# Patient Record
Sex: Female | Born: 1970 | Race: Black or African American | Hispanic: No | Marital: Single | Smoking: Former smoker
Health system: Southern US, Community
[De-identification: ages and names within clinical notes are randomized; demographics above are authoritative.]

## PROBLEM LIST (undated history)

## (undated) DIAGNOSIS — Z8041 Family history of malignant neoplasm of ovary: Secondary | ICD-10-CM

## (undated) DIAGNOSIS — C50919 Malignant neoplasm of unspecified site of unspecified female breast: Secondary | ICD-10-CM

## (undated) DIAGNOSIS — Z801 Family history of malignant neoplasm of trachea, bronchus and lung: Secondary | ICD-10-CM

## (undated) DIAGNOSIS — Z800 Family history of malignant neoplasm of digestive organs: Secondary | ICD-10-CM

## (undated) DIAGNOSIS — D649 Anemia, unspecified: Secondary | ICD-10-CM

## (undated) HISTORY — DX: Family history of malignant neoplasm of digestive organs: Z80.0

## (undated) HISTORY — DX: Family history of malignant neoplasm of ovary: Z80.41

## (undated) HISTORY — DX: Anemia, unspecified: D64.9

## (undated) HISTORY — DX: Family history of malignant neoplasm of trachea, bronchus and lung: Z80.1

---
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
## 2017-08-28 HISTORY — PX: OTHER SURGICAL HISTORY: SHX169

## 2017-11-26 HISTORY — PX: BREAST BIOPSY: SHX20

## 2017-12-26 DIAGNOSIS — C50919 Malignant neoplasm of unspecified site of unspecified female breast: Secondary | ICD-10-CM

## 2017-12-26 HISTORY — DX: Malignant neoplasm of unspecified site of unspecified female breast: C50.919

## 2018-02-25 HISTORY — PX: AUGMENTATION MAMMAPLASTY: SUR837

## 2018-02-25 HISTORY — PX: MASTECTOMY: SHX3

## 2019-04-01 ENCOUNTER — Other Ambulatory Visit: Payer: Self-pay

## 2019-04-02 ENCOUNTER — Other Ambulatory Visit: Payer: Self-pay

## 2019-04-02 ENCOUNTER — Encounter: Payer: Self-pay | Admitting: Oncology

## 2019-04-02 ENCOUNTER — Encounter (INDEPENDENT_AMBULATORY_CARE_PROVIDER_SITE_OTHER): Payer: Self-pay

## 2019-04-02 ENCOUNTER — Inpatient Hospital Stay: Payer: BC Managed Care – PPO | Attending: Oncology | Admitting: Oncology

## 2019-04-02 VITALS — BP 109/72 | HR 60 | Temp 98.3°F | Resp 16 | Ht 64.0 in | Wt 130.1 lb

## 2019-04-02 DIAGNOSIS — Z853 Personal history of malignant neoplasm of breast: Secondary | ICD-10-CM

## 2019-04-02 DIAGNOSIS — Z87891 Personal history of nicotine dependence: Secondary | ICD-10-CM | POA: Insufficient documentation

## 2019-04-02 DIAGNOSIS — Z9889 Other specified postprocedural states: Secondary | ICD-10-CM

## 2019-04-02 DIAGNOSIS — Z8041 Family history of malignant neoplasm of ovary: Secondary | ICD-10-CM | POA: Diagnosis not present

## 2019-04-02 DIAGNOSIS — Z791 Long term (current) use of non-steroidal anti-inflammatories (NSAID): Secondary | ICD-10-CM | POA: Diagnosis not present

## 2019-04-02 DIAGNOSIS — Z9882 Breast implant status: Secondary | ICD-10-CM | POA: Insufficient documentation

## 2019-04-02 NOTE — Progress Notes (Signed)
Pt moved here from [facility] and was dx in 2019 with breast cancer and ER, PR positive and she was told that she should take tamoxifen but she did not want to start them because she is trying to get pregnant.

## 2019-04-03 ENCOUNTER — Encounter: Payer: Self-pay | Admitting: Oncology

## 2019-04-03 DIAGNOSIS — Z853 Personal history of malignant neoplasm of breast: Secondary | ICD-10-CM | POA: Insufficient documentation

## 2019-04-03 DIAGNOSIS — Z8041 Family history of malignant neoplasm of ovary: Secondary | ICD-10-CM | POA: Insufficient documentation

## 2019-04-03 DIAGNOSIS — Z9889 Other specified postprocedural states: Secondary | ICD-10-CM | POA: Insufficient documentation

## 2019-04-03 NOTE — Progress Notes (Signed)
Hematology/Oncology Consult note [facility] Telephone:([phone] Fax:([phone]   Patient Care Team: Donnamarie Rossetti, PA-C as PCP - General (Family Medicine)  REFERRING PROVIDER: Grayland Ormond Hestle,*  CHIEF COMPLAINTS/REASON FOR VISIT:  Evaluation of breast cancer  HISTORY OF PRESENTING ILLNESS:   Judith Watkins is a  48 y.o.  female with PMH listed below was seen in consultation at the request of  Donnamarie Rossetti,*  for evaluation of breast cancer. Patient moved from [place] to [place] earlier this year. She has establish care with primary care provider and also wants to establish care with oncology for history of breast cancer. Detailed oncology history was not available to me at this point. I have some of the records. 12/20/2017 bilateral mammogram showed grouped amorphus calcification in a branching distribution are noted in the slightly inferior right breast.  Recommend biopsy. Multiple additional groups of calcification within the right breast.  Expectant management of this calcification is recommended.   Vascular ill-defined hypoechoic mass within the right breast at 9:00 axis, 6 cm from the nipple.  Biopsy recommended. Complex solid and cystic mass in the left breast at the 12:00 axis periareolar.  Recommend biopsy . #01/02/2018, ultrasound biopsy breast  I have patient's reports from 01/17/2018 MRI breast with and without contrast bilateral which showed extreme febrile glandular tissue, market nodular background parenchymal enhancement.  Innumerable bilateral scattered T2 hyperintense masses consistent with fibrocystic changes.  Biopsy clip is noted in the right lower outer breast at the site of stereotactic biopsy demonstrating DCIS. Biopsy clip is noted in the right slightly upper outer breast corresponding to the site of ultrasound-guided biopsy demonstrating DCIS.  1.1 cm x 1.2 cm of masslike enhancement surrounding this  biopsy site. Linear non-mass enhancement spanning approximately 5 cm x 3 cm is noted within the right outer breast.  A biopsy clip is noted in the left retroareolar breast at the site of benign biopsy for which pathology demonstrated benign breast tissue. No evidence of axillary lymphadenopathy.  I do not have final biopsy pathology and mastectomy pathology.  Per patient, she underwent left mastectomy with reconstruction.  She was told her cancer was estrogen receptor positive and was suggested to take tamoxifen.  Patient opted not to take tamoxifen due to desire of conceiving. Today she has no new complaints of her breast.  Occasionally she has soreness of right breast. Denies any fever, chills, chest pain, abdominal pain, back pain.  Review of Systems  Constitutional: Negative for appetite change, chills, fatigue and fever.  HENT:   Negative for hearing loss and voice change.   Eyes: Negative for eye problems.  Respiratory: Negative for chest tightness and cough.   Cardiovascular: Negative for chest pain.  Gastrointestinal: Negative for abdominal distention, abdominal pain and blood in stool.  Endocrine: Negative for hot flashes.  Genitourinary: Negative for difficulty urinating and frequency.   Musculoskeletal: Negative for arthralgias.  Skin: Negative for itching and rash.  Neurological: Negative for extremity weakness.  Hematological: Negative for adenopathy.  Psychiatric/Behavioral: Negative for confusion.    MEDICAL HISTORY:  Past Medical History:  Diagnosis Date  . Anemia   . Breast cancer ([place]) 12/2017   right    SURGICAL HISTORY: Past Surgical History:  Procedure Laterality Date  . right mastectomy Right 2019    SOCIAL HISTORY: Social History   Socioeconomic History  . Marital status: Single    Spouse name: Not on file  . Number of children: Not on file  . Years  of education: Not on file  . Highest education level: Not on file  Occupational History  . Not  on file  Social Needs  . Financial resource strain: Not on file  . Food insecurity    Worry: Not on file    Inability: Not on file  . Transportation needs    Medical: Not on file    Non-medical: Not on file  Tobacco Use  . Smoking status: Former Smoker    Packs/day: 0.25    Types: Cigarettes    Quit date: 2011    Years since quitting: 9.6  . Smokeless tobacco: Never Used  Substance and Sexual Activity  . Alcohol use: Yes    Alcohol/week: 2.0 standard drinks    Types: 2 Cans of beer per week  . Drug use: Never  . Sexual activity: Yes  Lifestyle  . Physical activity    Days per week: Not on file    Minutes per session: Not on file  . Stress: Not on file  Relationships  . Social Herbalist on phone: Not on file    Gets together: Not on file    Attends religious service: Not on file    Active member of club or organization: Not on file    Attends meetings of clubs or organizations: Not on file    Relationship status: Not on file  . Intimate partner violence    Fear of current or ex partner: Not on file    Emotionally abused: Not on file    Physically abused: Not on file    Forced sexual activity: Not on file  Other Topics Concern  . Not on file  Social History Narrative  . Not on file    FAMILY HISTORY: Family History  Problem Relation Age of Onset  . Ovarian cancer Mother   . Lung cancer Father     ALLERGIES:  has No Known Allergies.  MEDICATIONS:  Current Outpatient Medications  Medication Sig Dispense Refill  . ferrous sulfate 325 (65 FE) MG tablet Take 325 mg by mouth 2 (two) times daily.    Marland Kitchen ibuprofen (ADVIL) 200 MG tablet Take 600 mg by mouth every 6 (six) hours as needed.    . Prenatal 28-0.8 MG TABS Take 1 tablet by mouth daily.     No current facility-administered medications for this visit.      PHYSICAL EXAMINATION: ECOG PERFORMANCE STATUS: 0 - Asymptomatic Vitals:   04/02/19 1105  BP: 109/72  Pulse: 60  Resp: 16  Temp: 98.3  F (36.8 C)   Filed Weights   04/02/19 1105  Weight: 130 lb 1.6 oz (59 kg)    Physical Exam Constitutional:      General: She is not in acute distress. HENT:     Head: Normocephalic and atraumatic.  Eyes:     General: No scleral icterus.    Pupils: Pupils are equal, round, and reactive to light.  Neck:     Musculoskeletal: Normal range of motion and neck supple.  Cardiovascular:     Rate and Rhythm: Normal rate and regular rhythm.     Heart sounds: Normal heart sounds.  Pulmonary:     Effort: Pulmonary effort is normal. No respiratory distress.     Breath sounds: No wheezing.  Abdominal:     General: Bowel sounds are normal. There is no distension.     Palpations: Abdomen is soft. There is no mass.     Tenderness: There is no abdominal  tenderness.  Musculoskeletal: Normal range of motion.        General: No deformity.  Skin:    General: Skin is warm and dry.     Findings: No erythema or rash.  Neurological:     Mental Status: She is alert and oriented to person, place, and time.     Cranial Nerves: No cranial nerve deficit.     Coordination: Coordination normal.  Psychiatric:        Behavior: Behavior normal.        Thought Content: Thought content normal.   Breast exam is performed in seated and lying down position. Patient is status post right mastectomy with reconstruction. The implant edges are intact and there is no evidence of any chest wall recurrence.  No palpable axillary lymphadenopathy. Lumpy left breast, no discrete mass.    LABORATORY DATA:  I have reviewed the data as listed No results found for: WBC, HGB, HCT, MCV, PLT No results for input(s): NA, K, CL, CO2, GLUCOSE, BUN, CREATININE, CALCIUM, GFRNONAA, GFRAA, PROT, ALBUMIN, AST, ALT, ALKPHOS, BILITOT, BILIDIR, IBILI in the last 8760 hours. Iron/TIBC/Ferritin/ %Sat No results found for: IRON, TIBC, FERRITIN, IRONPCTSAT   Reviewed patient's blood work at [facility]. 03/21/2019 WBC  3.3, hemoglobin 12.6, MCV 84.5, platelet 1 76,000. Chemistry sodium 140, potassium 4.4, chloride 107, creatinine 0.7, AST 17, ALT 13, alkaline phosphatase 39 bilirubin 0.3.  RADIOGRAPHIC STUDIES: I have personally reviewed the radiological images as listed and agreed with the findings in the report. No results found.    ASSESSMENT & PLAN:  1. History of breast cancer in female   2. Family history of ovarian cancer   3. H/O breast reconstruction    #Reviewed limited oncology records. Will need to obtain biopsy and mastectomy pathology and her oncology notes. Patient was recommended to start on tamoxifen by previous oncologist.  She declined due to desire of trying to conceive.  # Left breast is due for screening mammogram, will obtain.  # mild right breast soreness,  She will need to establish care with plastic surgeon  for follow-up for the history of breast reconstruction. Advise patient to take calcium and vitamin D supplementation.  Family history of ovarian cancer, recommend genetic testing.  Patient is not sure if she has been tested before. She will update me if she desires genetic testing and will refer her to genetic counselor if she is interested.   Orders Placed This Encounter  Procedures  . CBC with Differential/Platelet    Standing Status:   Future    Standing Expiration Date:   04/02/2020  . Comprehensive metabolic panel    Standing Status:   Future    Standing Expiration Date:   04/02/2020    All questions were answered. The patient knows to call the clinic with any problems questions or concerns.  cc Venetia Maxon, Rolanda Jay,*    Return of visit: 3 months, check cbc and cmp Thank you for this kind referral and the opportunity to participate in the care of this patient. A copy of today's note is routed to referring provider  Total face to face encounter time for this patient visit was 45 min. >50% of the time was  spent in counseling and coordination of care.     Earlie Server, MD, PhD Hematology Oncology [facility] at [facility] Pager- [phone] 04/03/2019

## 2019-04-25 ENCOUNTER — Other Ambulatory Visit: Payer: Self-pay | Admitting: Family Medicine

## 2019-04-25 DIAGNOSIS — Z853 Personal history of malignant neoplasm of breast: Secondary | ICD-10-CM

## 2019-04-25 DIAGNOSIS — Z9011 Acquired absence of right breast and nipple: Secondary | ICD-10-CM

## 2019-04-29 ENCOUNTER — Encounter: Payer: Self-pay | Admitting: Oncology

## 2019-04-29 DIAGNOSIS — Z7189 Other specified counseling: Secondary | ICD-10-CM | POA: Insufficient documentation

## 2019-05-09 ENCOUNTER — Other Ambulatory Visit: Payer: BC Managed Care – PPO

## 2019-05-21 ENCOUNTER — Ambulatory Visit
Admission: RE | Admit: 2019-05-21 | Discharge: 2019-05-21 | Disposition: A | Discharge status: Home / Self Care | Payer: BC Managed Care – PPO | Source: Ambulatory Visit | Attending: Family Medicine | Admitting: Family Medicine

## 2019-05-21 DIAGNOSIS — Z853 Personal history of malignant neoplasm of breast: Secondary | ICD-10-CM | POA: Diagnosis present

## 2019-05-21 DIAGNOSIS — Z9011 Acquired absence of right breast and nipple: Secondary | ICD-10-CM | POA: Diagnosis present

## 2019-06-30 ENCOUNTER — Inpatient Hospital Stay: Payer: BC Managed Care – PPO | Admitting: Oncology

## 2019-06-30 ENCOUNTER — Inpatient Hospital Stay: Payer: BC Managed Care – PPO

## 2019-07-08 ENCOUNTER — Telehealth: Payer: Self-pay

## 2019-07-08 ENCOUNTER — Other Ambulatory Visit: Payer: Self-pay

## 2019-07-08 ENCOUNTER — Encounter: Payer: Self-pay | Admitting: Oncology

## 2019-07-08 ENCOUNTER — Inpatient Hospital Stay: Payer: BC Managed Care – PPO | Attending: Oncology

## 2019-07-08 ENCOUNTER — Inpatient Hospital Stay (HOSPITAL_BASED_OUTPATIENT_CLINIC_OR_DEPARTMENT_OTHER): Payer: BC Managed Care – PPO | Admitting: Oncology

## 2019-07-08 VITALS — BP 122/79 | HR 64 | Temp 98.4°F | Resp 16 | Wt 133.3 lb

## 2019-07-08 DIAGNOSIS — Z9011 Acquired absence of right breast and nipple: Secondary | ICD-10-CM | POA: Diagnosis not present

## 2019-07-08 DIAGNOSIS — Z8041 Family history of malignant neoplasm of ovary: Secondary | ICD-10-CM | POA: Insufficient documentation

## 2019-07-08 DIAGNOSIS — Z853 Personal history of malignant neoplasm of breast: Secondary | ICD-10-CM | POA: Diagnosis not present

## 2019-07-08 DIAGNOSIS — Z791 Long term (current) use of non-steroidal anti-inflammatories (NSAID): Secondary | ICD-10-CM | POA: Diagnosis not present

## 2019-07-08 DIAGNOSIS — C50411 Malignant neoplasm of upper-outer quadrant of right female breast: Secondary | ICD-10-CM | POA: Diagnosis not present

## 2019-07-08 DIAGNOSIS — Z801 Family history of malignant neoplasm of trachea, bronchus and lung: Secondary | ICD-10-CM | POA: Diagnosis not present

## 2019-07-08 DIAGNOSIS — Z87891 Personal history of nicotine dependence: Secondary | ICD-10-CM | POA: Insufficient documentation

## 2019-07-08 DIAGNOSIS — Z79899 Other long term (current) drug therapy: Secondary | ICD-10-CM | POA: Insufficient documentation

## 2019-07-08 DIAGNOSIS — Z170 Estrogen receptor positive status [ER+]: Secondary | ICD-10-CM | POA: Insufficient documentation

## 2019-07-08 LAB — COMPREHENSIVE METABOLIC PANEL
ALT: 11 U/L (ref 0–44)
AST: 17 U/L (ref 15–41)
Albumin: 3.7 g/dL (ref 3.5–5.0)
Alkaline Phosphatase: 35 U/L — ABNORMAL LOW (ref 38–126)
Anion gap: 3 — ABNORMAL LOW (ref 5–15)
BUN: 11 mg/dL (ref 6–20)
CO2: 27 mmol/L (ref 22–32)
Calcium: 8.8 mg/dL — ABNORMAL LOW (ref 8.9–10.3)
Chloride: 107 mmol/L (ref 98–111)
Creatinine, Ser: 0.78 mg/dL (ref 0.44–1.00)
GFR calc Af Amer: >60 mL/min (ref >60)
GFR calc non Af Amer: >60 mL/min (ref >60)
Glucose, Bld: 74 mg/dL (ref 70–99)
Potassium: 3.8 mmol/L (ref 3.5–5.1)
Sodium: 137 mmol/L (ref 135–145)
Total Bilirubin: 0.5 mg/dL (ref 0.3–1.2)
Total Protein: 7.3 g/dL (ref 6.5–8.1)

## 2019-07-08 LAB — CBC WITH DIFFERENTIAL/PLATELET
Abs Immature Granulocytes: 0 10*3/uL (ref 0.00–0.07)
Basophils Absolute: 0 10*3/uL (ref 0.0–0.1)
Basophils Relative: 1 %
Eosinophils Absolute: 0.2 10*3/uL (ref 0.0–0.5)
Eosinophils Relative: 4 %
HCT: 36.4 % (ref 36.0–46.0)
Hemoglobin: 11.8 g/dL — ABNORMAL LOW (ref 12.0–15.0)
Immature Granulocytes: 0 %
Lymphocytes Relative: 30 %
Lymphs Abs: 1.1 10*3/uL (ref 0.7–4.0)
MCH: 28.3 pg (ref 26.0–34.0)
MCHC: 32.4 g/dL (ref 30.0–36.0)
MCV: 87.3 fL (ref 80.0–100.0)
Monocytes Absolute: 0.3 10*3/uL (ref 0.1–1.0)
Monocytes Relative: 7 %
Neutro Abs: 2.1 10*3/uL (ref 1.7–7.7)
Neutrophils Relative %: 58 %
Platelets: 205 10*3/uL (ref 150–400)
RBC: 4.17 MIL/uL (ref 3.87–5.11)
RDW: 11.9 % (ref 11.5–15.5)
WBC: 3.7 10*3/uL — ABNORMAL LOW (ref 4.0–10.5)
nRBC: 0 % (ref 0.0–0.2)

## 2019-07-08 NOTE — Telephone Encounter (Signed)
error 

## 2019-07-08 NOTE — Progress Notes (Signed)
Patient does not offer any problems today.  

## 2019-07-08 NOTE — Progress Notes (Signed)
Hematology/Oncology Consult note [facility] [phone] Fax:([phone]   Patient Care Team: Donnamarie Rossetti, PA-C as PCP - General (Family Medicine)  REFERRING PROVIDER: Grayland Ormond Hestle,*  CHIEF COMPLAINTS/REASON FOR VISIT:  Follow up breast cancer  HISTORY OF PRESENTING ILLNESS:   Judith Watkins is a  48 y.o.  female with PMH listed below was seen in consultation at the request of  Donnamarie Rossetti,*  for evaluation of breast cancer. Patient moved from [place] to [place] earlier this year. She has establish care with primary care provider and also wants to establish care with oncology for history of breast cancer. Detailed oncology history was not available to me at this point. I have some of the records. 12/20/2017 bilateral mammogram showed grouped amorphus calcification in a branching distribution are noted in the slightly inferior right breast.  Recommend biopsy. Multiple additional groups of calcification within the right breast.  Expectant management of this calcification is recommended.   Vascular ill-defined hypoechoic mass within the right breast at 9:00 axis, 6 cm from the nipple.  Biopsy recommended. Complex solid and cystic mass in the left breast at the 12:00 axis periareolar.  Recommend biopsy . #01/02/2018, ultrasound biopsy breast  I have patient's reports from 01/17/2018 MRI breast with and without contrast bilateral which showed extreme febrile glandular tissue, market nodular background parenchymal enhancement.  Innumerable bilateral scattered T2 hyperintense masses consistent with fibrocystic changes.  Biopsy clip is noted in the right lower outer breast at the site of stereotactic biopsy demonstrating DCIS. Biopsy clip is noted in the right slightly upper outer breast corresponding to the site of ultrasound-guided biopsy demonstrating DCIS.  1.1 cm x 1.2 cm of masslike enhancement surrounding this biopsy  site. Linear non-mass enhancement spanning approximately 5 cm x 3 cm is noted within the right outer breast.  A biopsy clip is noted in the left retroareolar breast at the site of benign biopsy for which pathology demonstrated benign breast tissue. No evidence of axillary lymphadenopathy.  01/07/2019 Right biopsy showed DCIS, left breast pathology is benign.   INTERVAL HISTORY Believe Robbin is a 48 y.o. female who has above history reviewed by me today presents for follow up visit for management of history of breast cancer.  Additional records were obtained from previous oncology.  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  Margin is negative for both invasive and DCIS negative, 0 out of 1 sentinel lymph nodes biopsy.ER>90%, PR 80-90% pT65mi pN0(sn)  Previous oncologist recommended chemoprevention with tamoxifen. Patient declined due to plan of conceiving.  Tamoxifen is contraindicated with tamoxifen. Given her age and the likely waning fertility, tamoxifen was deferred as patient desires to Pursue pregnancy.  Today patient reports doing well.  Denies any fever, chills, chest pain, abdominal pain, back pain.  No new complaints.   Review of Systems  Constitutional: Negative for appetite change, chills, fatigue and fever.  HENT:   Negative for hearing loss and voice change.   Eyes: Negative for eye problems.  Respiratory: Negative for chest tightness and cough.   Cardiovascular: Negative for chest pain.  Gastrointestinal: Negative for abdominal distention, abdominal pain and blood in stool.  Endocrine: Negative for hot flashes.  Genitourinary: Negative for difficulty urinating and frequency.   Musculoskeletal: Negative for arthralgias.  Skin:  Negative for itching and rash.  Neurological: Negative for extremity weakness.  Hematological: Negative for adenopathy.  Psychiatric/Behavioral: Negative for confusion.    MEDICAL HISTORY:  Past Medical History:  Diagnosis Date  . Anemia   . 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 AUGMENTATION MAMMAPLASTY Right 02/2018   due to mastectomy  . BREAST BIOPSY Left 11/2017   [place] bx mass at 12:00 periareolar benign breast tissue with cyst  . BREAST BIOPSY Right 11/2017   stereo bx of calcs,  HG DCIS  . BREAST BIOPSY Right 11/2017   [place] bx of 9:00 6 cmfn, DCIS intermediate grade with cribriform architecture  . MASTECTOMY Right 02/2018   DCIS high grade of two separte areas in right breast.  Per pt, LN clear   . right mastectomy Right 2019    SOCIAL HISTORY: Social History   Socioeconomic History  . Marital status: Single    Spouse name: Not on file  . Number of children: Not on file  . Years of education: Not on file  . Highest education level: Not on file  Occupational History  . Not on file  Social Needs  . Financial resource strain: Not on file  . Food insecurity    Worry: Not on file    Inability: Not on file  . Transportation needs    Medical: Not on file    Non-medical: Not on file  Tobacco Use  . Smoking status: Former Smoker    Packs/day: 0.25    Types: Cigarettes    Quit date: 2011    Years since quitting: 9.8  . Smokeless tobacco: Never Used  Substance and Sexual Activity  . Alcohol use: Yes    Alcohol/week: 2.0 standard drinks    Types: 2 Cans of beer per week  . Drug use: Never  . Sexual activity: Yes  Lifestyle  . Physical activity    Days per week: Not on file    Minutes per session: Not on file  . Stress: Not on file  Relationships  . Social Herbalist on phone: Not on file    Gets together: Not on file    Attends religious  service: Not on file    Active member of club or organization: Not on file    Attends meetings of clubs or organizations: Not on file    Relationship status: Not on file  . Intimate partner violence    Fear of current or ex partner: Not on file    Emotionally abused: Not on file    Physically abused: Not on file    Forced sexual activity: Not on file  Other Topics Concern  . Not on file  Social History Narrative  . Not on file    FAMILY HISTORY: Family History  Problem Relation Age of Onset  . Ovarian cancer Mother 43  . Lung cancer Father     ALLERGIES:  has No Known Allergies.  MEDICATIONS:  Current Outpatient Medications  Medication Sig Dispense Refill  . ferrous sulfate 325 (65 FE) MG tablet Take 325 mg by mouth 2 (two) times daily.    Marland Kitchen ibuprofen (ADVIL) 200 MG tablet Take 600 mg by mouth every 6 (six) hours as needed.    . Prenatal 28-0.8 MG TABS Take 1 tablet by mouth daily.     No current facility-administered medications for this  visit.      PHYSICAL EXAMINATION: ECOG PERFORMANCE STATUS: 0 - Asymptomatic Vitals:   07/08/19 1424  BP: 122/79  Pulse: 64  Resp: 16  Temp: 98.4 F (36.9 C)  SpO2: 100%   Filed Weights   07/08/19 1424  Weight: 133 lb 4.8 oz (60.5 kg)    Physical Exam Constitutional:      General: She is not in acute distress. HENT:     Head: Normocephalic and atraumatic.  Eyes:     General: No scleral icterus.    Pupils: Pupils are equal, round, and reactive to light.  Neck:     Musculoskeletal: Normal range of motion and neck supple.  Cardiovascular:     Rate and Rhythm: Normal rate and regular rhythm.     Heart sounds: Normal heart sounds.  Pulmonary:     Effort: Pulmonary effort is normal. No respiratory distress.     Breath sounds: No wheezing.  Abdominal:     General: Bowel sounds are normal. There is no distension.     Palpations: Abdomen is soft. There is no mass.     Tenderness: There is no abdominal tenderness.   Musculoskeletal: Normal range of motion.        General: No deformity.  Skin:    General: Skin is warm and dry.     Findings: No erythema or rash.  Neurological:     Mental Status: She is alert and oriented to person, place, and time.     Cranial Nerves: No cranial nerve deficit.     Coordination: Coordination normal.  Psychiatric:        Behavior: Behavior normal.        Thought Content: Thought content normal.       LABORATORY DATA:  I have reviewed the data as listed Lab Results  Component Value Date   WBC 3.7 (L) 07/08/2019   HGB 11.8 (L) 07/08/2019   HCT 36.4 07/08/2019   MCV 87.3 07/08/2019   PLT 205 07/08/2019   Recent Labs    07/08/19 1407  NA 137  K 3.8  CL 107  CO2 27  GLUCOSE 74  BUN 11  CREATININE 0.78  CALCIUM 8.8*  GFRNONAA >60  GFRAA >60  PROT 7.3  ALBUMIN 3.7  AST 17  ALT 11  ALKPHOS 35*  BILITOT 0.5   Iron/TIBC/Ferritin/ %Sat No results found for: IRON, TIBC, FERRITIN, IRONPCTSAT   Reviewed patient's blood work at [employer]. 03/21/2019 WBC 3.3, hemoglobin 12.6, MCV 84.5, platelet 1 76,000. Chemistry sodium 140, potassium 4.4, chloride 107, creatinine 0.7, AST 17, ALT 13, alkaline phosphatase 39 bilirubin 0.3.  RADIOGRAPHIC STUDIES: I have personally reviewed the radiological images as listed and agreed with the findings in the report. No results found.    ASSESSMENT & PLAN:  1. History of breast cancer in female   2. Family history of ovarian cancer    We obtained and reviewed additional oncology records. Stage I right breast cancer, ER/PR positive Currently not on tamoxifen as patient desires to pursue pregnancy.  We discussed about that tamoxifen is contraindicated in pregnancy. We discussed about genetic testing again today.  Patient is not interested and she declined.  Left unilateral diagnostic mammogram/ultrasound was done on 05/21/2019.  Images were independently reviewed by me.  No suspicious findings.   Recommend patient to proceed with diagnostic mammogram of the left in 1 year.  Recommend patient to take calcium and vitamin D supplementation.  Family history of ovarian cancer, recommend  genetic testing.  Patient is not sure if she has been tested before. She will update me if she desires genetic testing and will refer her to genetic counselor if she is interested.   Orders Placed This Encounter  Procedures  . CBC with Differential/Platelet    Standing Status:   Future    Standing Expiration Date:   07/07/2020  . Comprehensive metabolic panel    Standing Status:   Future    Standing Expiration Date:   07/07/2020    All questions were answered. The patient knows to call the clinic with any problems questions or concerns.  cc Venetia Maxon, Rolanda Jay,*    Return of visit: 6 months, check cbc and cmp We spent sufficient time to discuss many aspect of care, questions were answered to patient's satisfaction. Total face to face encounter time for this patient visit was 25 min. >50% of the time was  spent in counseling and coordination of care.     Earlie Server, MD, PhD Hematology Oncology [facility] at [facility] Pager- [phone] 07/08/2019

## 2019-08-08 ENCOUNTER — Other Ambulatory Visit: Payer: Self-pay

## 2019-08-08 DIAGNOSIS — Z20822 Contact with and (suspected) exposure to covid-19: Secondary | ICD-10-CM

## 2019-08-09 LAB — NOVEL CORONAVIRUS, NAA: SARS-CoV-2, NAA: NOT DETECTED

## 2019-10-05 IMAGING — MG MM DIGITAL DIAGNOSTIC UNILAT*L* W/ TOMO W/ CAD
4 series · 4 of 12 positions shown · non-contrast
Comparison: Previous exam(s).

CLINICAL DATA: 47-year-old female with history of right breast DCIS
status post mastectomy in 0114. She had an ultrasound-guided biopsy
at an outside facility (01/02/2018) for a complex mass in the left
breast at 12 o'clock, and six-month follow-up ultrasound was
recommended. Pathology showed benign breast tissue and fibrocystic
changes.

EXAM:
DIGITAL DIAGNOSTIC LEFT MAMMOGRAM WITH CAD AND TOMO
ULTRASOUND LEFT BREAST

[L CC synth-2D]
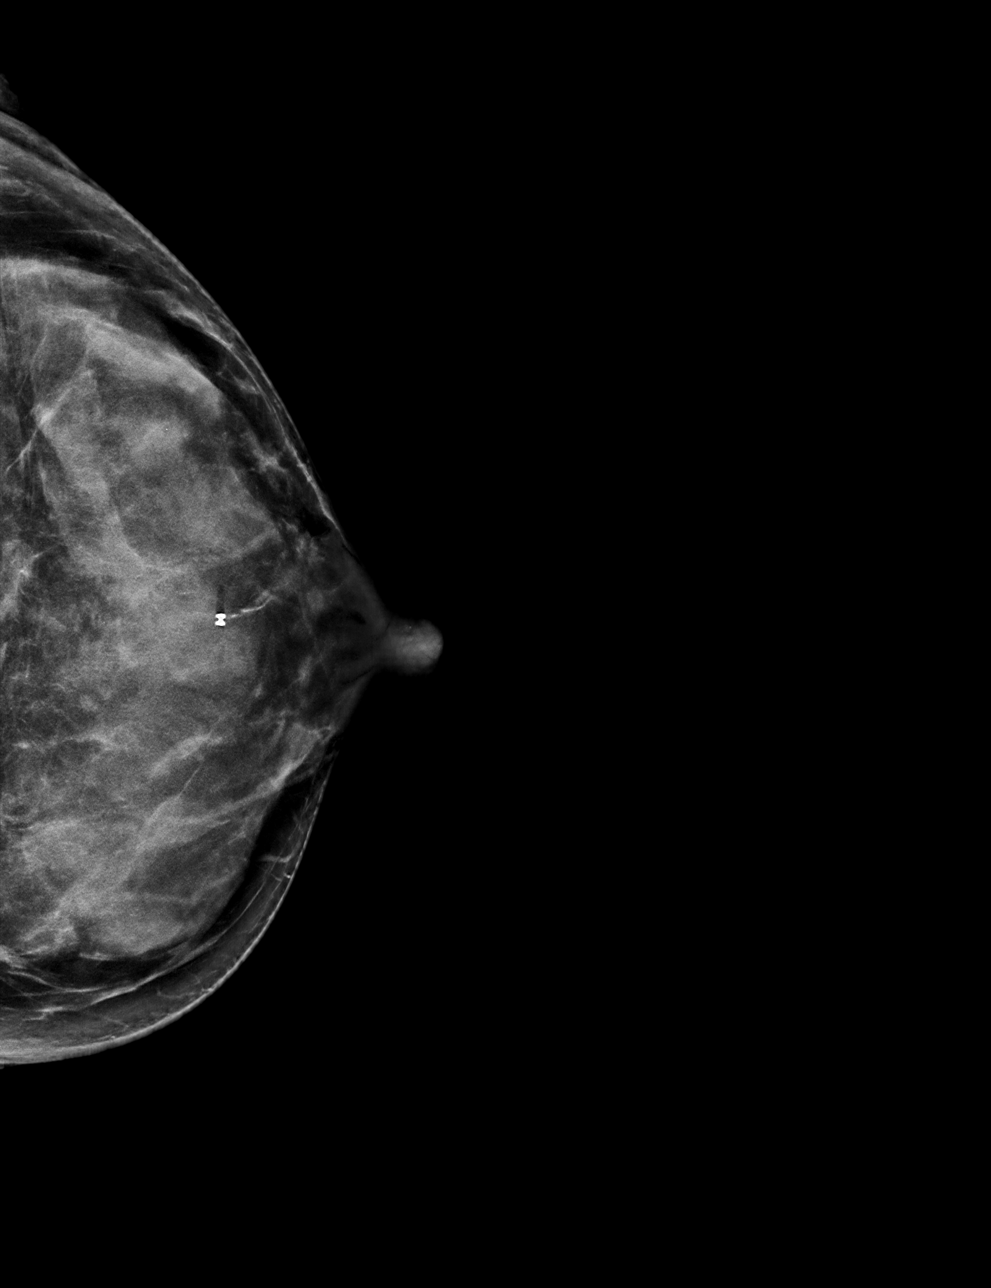

[L MLO synth-2D]
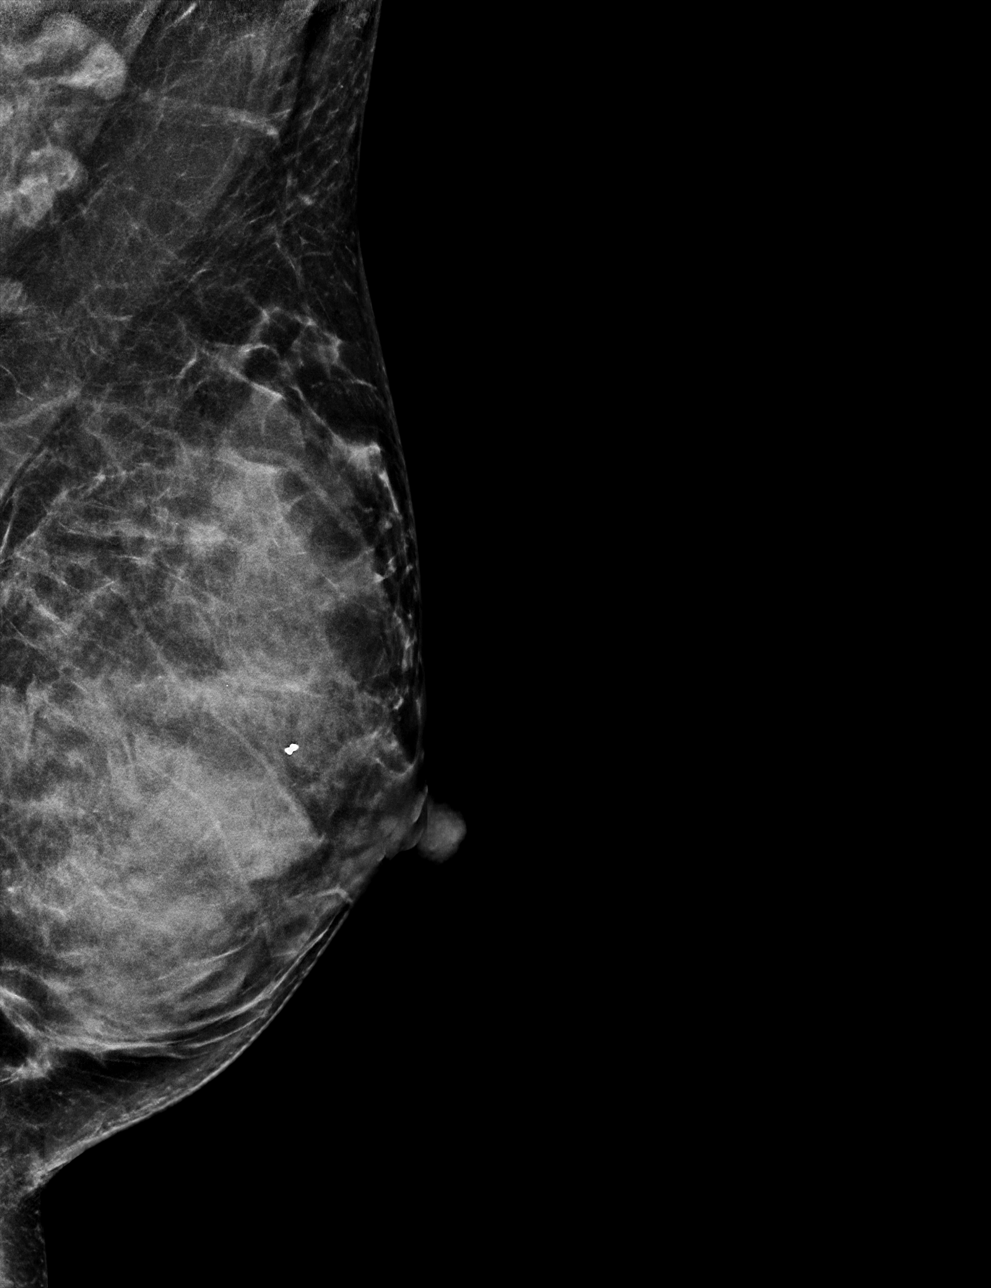

[L CC tomo · tomo slice 32/63.0]
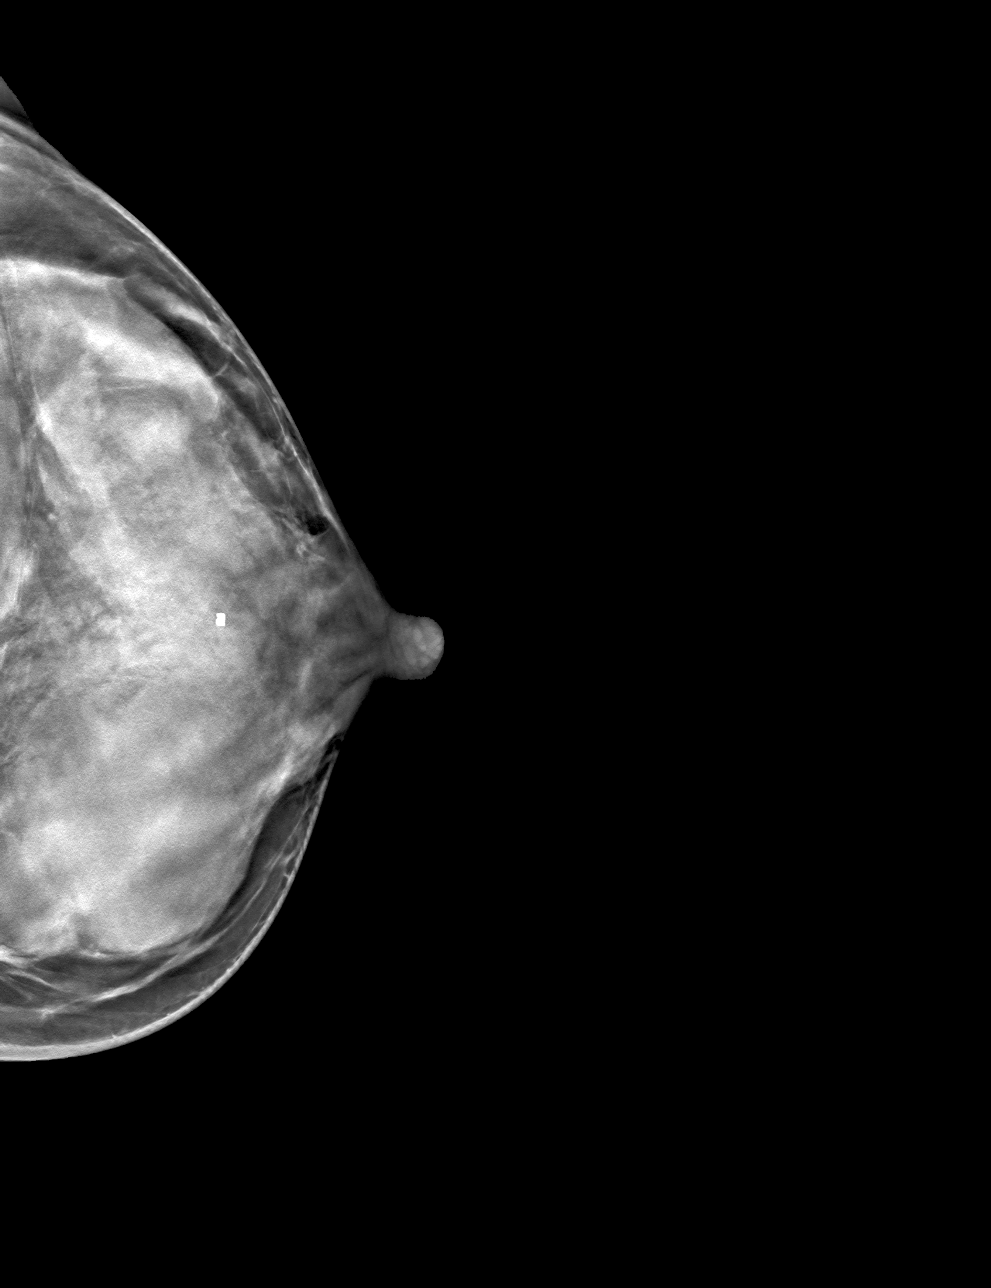

[L MLO tomo · tomo slice 28/55.0]
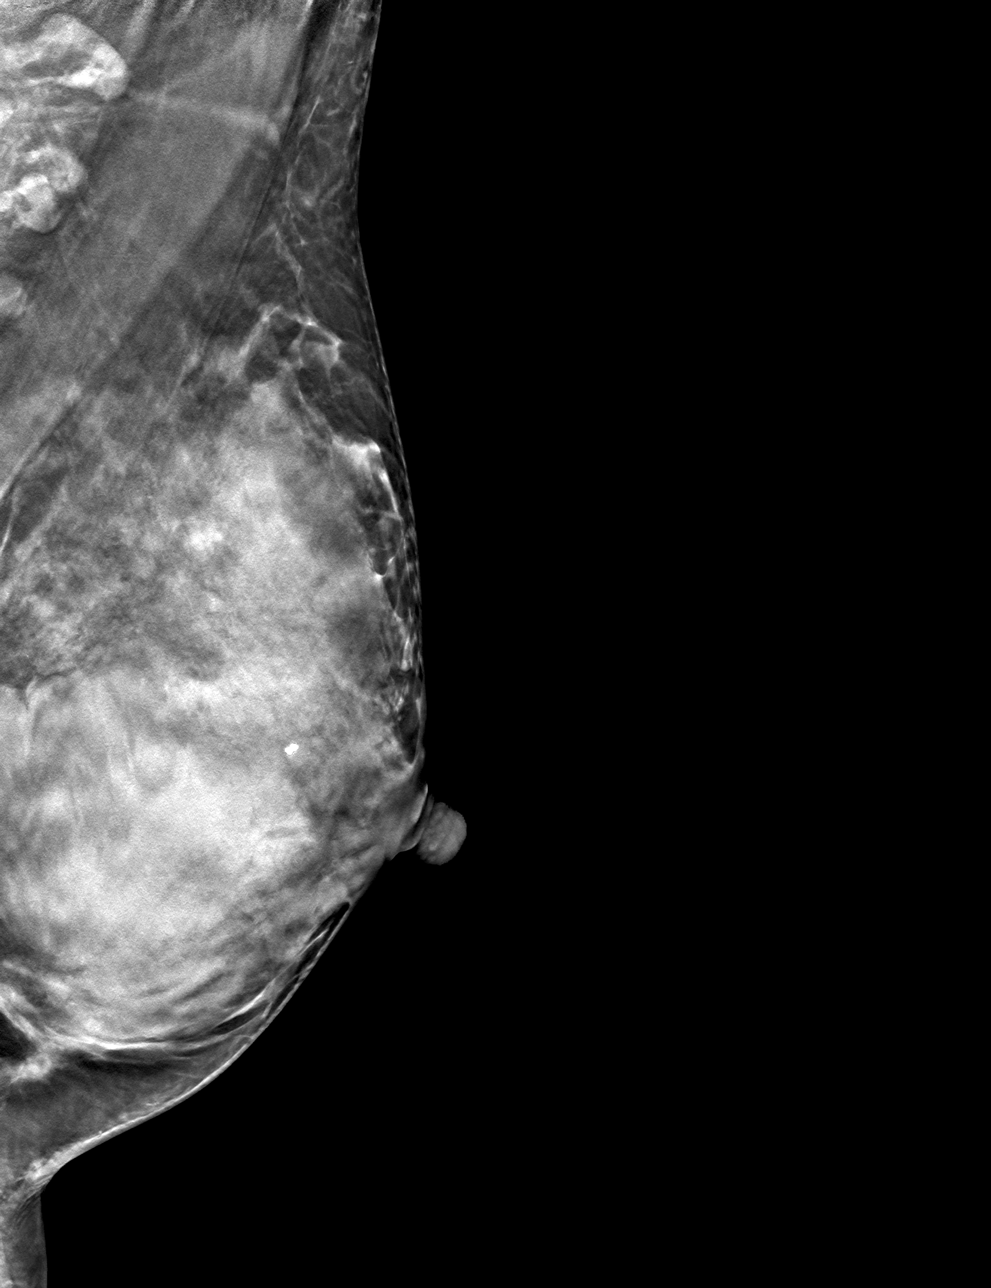

[4 of 12 positions shown; findings below may reference images not displayed]

ACR Breast Density Category d: The breast tissue is extremely dense,
which lowers the sensitivity of mammography.
FINDINGS: A dumbbell-shaped biopsy marking clip is seen in the superior
retroareolar left breast from the benign biopsy performed in 0114.
Multiple obscured masses are seen in the left breast, who is
visualized margins are circumscribed. This corresponds well with the
numerous cysts seen on the patient's MRI from 01/17/2018. No
suspicious calcifications, masses or areas of distortion are seen in
the left breast.

Mammographic images were processed with CAD.

Ultrasound of the left breast at 12 o'clock in the retroareolar
location demonstrates the biopsy marking clip with no residual
associated mass. Several scattered benign cysts are visualized, one
of which is partially captured in the ultrasound images.
IMPRESSION: 1. There is no residual mass at the site of biopsy in the left
breast at 12 o'clock.

2.  No mammographic evidence of malignancy in the left breast.

RECOMMENDATION:
1.  Screening mammogram in one year.(Code:[id number])

I have discussed the findings and recommendations with the patient.
If applicable, a reminder letter will be sent to the patient
regarding the next appointment.

BI-RADS CATEGORY  2: Benign.

## 2019-10-24 ENCOUNTER — Inpatient Hospital Stay: Payer: Self-pay | Admitting: Oncology

## 2019-11-04 ENCOUNTER — Encounter: Payer: Self-pay | Admitting: Oncology

## 2019-11-04 ENCOUNTER — Other Ambulatory Visit: Payer: Self-pay

## 2019-11-04 ENCOUNTER — Inpatient Hospital Stay: Payer: 59 | Attending: Oncology | Admitting: Oncology

## 2019-11-04 ENCOUNTER — Inpatient Hospital Stay: Payer: 59 | Admitting: *Deleted

## 2019-11-04 VITALS — BP 115/77 | HR 69 | Temp 97.3°F | Resp 18 | Wt 135.5 lb

## 2019-11-04 DIAGNOSIS — N63 Unspecified lump in unspecified breast: Secondary | ICD-10-CM | POA: Diagnosis not present

## 2019-11-04 DIAGNOSIS — Z9882 Breast implant status: Secondary | ICD-10-CM | POA: Insufficient documentation

## 2019-11-04 DIAGNOSIS — Z87891 Personal history of nicotine dependence: Secondary | ICD-10-CM | POA: Insufficient documentation

## 2019-11-04 DIAGNOSIS — Z853 Personal history of malignant neoplasm of breast: Secondary | ICD-10-CM

## 2019-11-04 DIAGNOSIS — Z8041 Family history of malignant neoplasm of ovary: Secondary | ICD-10-CM

## 2019-11-04 DIAGNOSIS — Z9011 Acquired absence of right breast and nipple: Secondary | ICD-10-CM | POA: Insufficient documentation

## 2019-11-04 DIAGNOSIS — N6320 Unspecified lump in the left breast, unspecified quadrant: Secondary | ICD-10-CM | POA: Diagnosis not present

## 2019-11-04 DIAGNOSIS — Z801 Family history of malignant neoplasm of trachea, bronchus and lung: Secondary | ICD-10-CM | POA: Insufficient documentation

## 2019-11-04 LAB — COMPREHENSIVE METABOLIC PANEL
ALT: 12 U/L (ref 0–44)
AST: 19 U/L (ref 15–41)
Albumin: 3.9 g/dL (ref 3.5–5.0)
Alkaline Phosphatase: 44 U/L (ref 38–126)
Anion gap: 7 (ref 5–15)
BUN: 12 mg/dL (ref 6–20)
CO2: 25 mmol/L (ref 22–32)
Calcium: 8.8 mg/dL — ABNORMAL LOW (ref 8.9–10.3)
Chloride: 103 mmol/L (ref 98–111)
Creatinine, Ser: 0.62 mg/dL (ref 0.44–1.00)
GFR calc Af Amer: >60 mL/min (ref >60)
GFR calc non Af Amer: >60 mL/min (ref >60)
Glucose, Bld: 90 mg/dL (ref 70–99)
Potassium: 3.9 mmol/L (ref 3.5–5.1)
Sodium: 135 mmol/L (ref 135–145)
Total Bilirubin: 0.5 mg/dL (ref 0.3–1.2)
Total Protein: 7.8 g/dL (ref 6.5–8.1)

## 2019-11-04 LAB — CBC WITH DIFFERENTIAL/PLATELET
Abs Immature Granulocytes: 0.01 10*3/uL (ref 0.00–0.07)
Basophils Absolute: 0 10*3/uL (ref 0.0–0.1)
Basophils Relative: 1 %
Eosinophils Absolute: 0.2 10*3/uL (ref 0.0–0.5)
Eosinophils Relative: 6 %
HCT: 36.8 % (ref 36.0–46.0)
Hemoglobin: 11.2 g/dL — ABNORMAL LOW (ref 12.0–15.0)
Immature Granulocytes: 0 %
Lymphocytes Relative: 37 %
Lymphs Abs: 1.4 10*3/uL (ref 0.7–4.0)
MCH: 25.7 pg — ABNORMAL LOW (ref 26.0–34.0)
MCHC: 30.4 g/dL (ref 30.0–36.0)
MCV: 84.6 fL (ref 80.0–100.0)
Monocytes Absolute: 0.3 10*3/uL (ref 0.1–1.0)
Monocytes Relative: 8 %
Neutro Abs: 1.8 10*3/uL (ref 1.7–7.7)
Neutrophils Relative %: 48 %
Platelets: 218 10*3/uL (ref 150–400)
RBC: 4.35 MIL/uL (ref 3.87–5.11)
RDW: 12.6 % (ref 11.5–15.5)
WBC: 3.7 10*3/uL — ABNORMAL LOW (ref 4.0–10.5)
nRBC: 0 % (ref 0.0–0.2)

## 2019-11-04 LAB — HCG, QUANTITATIVE, PREGNANCY: hCG, Beta Chain, Quant, S: <1 m[IU]/mL (ref <5)

## 2019-11-04 NOTE — Progress Notes (Signed)
Patient here for follow up. Pt reports that she feels a lump to left breast with occasional pain

## 2019-11-04 NOTE — Progress Notes (Signed)
Hematology/Oncology follow up note [facility] Telephone:(336) [phone] Fax:(336) [phone]   Patient Care Team: Donnamarie Rossetti, PA-C as PCP - General (Family Medicine)  REFERRING PROVIDER: Grayland Ormond Hestle,*  CHIEF COMPLAINTS/REASON FOR VISIT:  Follow up breast cancer  HISTORY OF PRESENTING ILLNESS:  Judith Watkins is a  49 y.o.  female with PMH listed below was seen in consultation at the request of  Donnamarie Rossetti,*  for evaluation of breast cancer. Patient moved from [place] to [place] earlier this year. She has establish care with primary care provider and also wants to establish care with oncology for history of breast cancer. Detailed oncology history was not available to me at this point. I have some of the records. 12/20/2017 bilateral mammogram showed grouped amorphus calcification in a branching distribution are noted in the slightly inferior right breast.  Recommend biopsy. Multiple additional groups of calcification within the right breast.  Expectant management of this calcification is recommended.   Vascular ill-defined hypoechoic mass within the right breast at 9:00 axis, 6 cm from the nipple.  Biopsy recommended. Complex solid and cystic mass in the left breast at the 12:00 axis periareolar.  Recommend biopsy . #01/02/2018, ultrasound biopsy breast  I have patient's reports from 01/17/2018 MRI breast with and without contrast bilateral which showed extreme fibroglandular tissue, market nodular background parenchymal enhancement.  Innumerable bilateral scattered T2 hyperintense mfibrogladular asses consistent with fibrocystic changes.  Biopsy clip is noted in the right lower outer breast at the site of stereotactic biopsy demonstrating DCIS. Biopsy clip is noted in the right slightly upper outer breast corresponding to the site of ultrasound-guided biopsy demonstrating DCIS.  1.1 cm x 1.2 cm of masslike enhancement surrounding  this biopsy site. Linear non-mass enhancement spanning approximately 5 cm x 3 cm is noted within the right outer breast.  A biopsy clip is noted in the left retroareolar breast at the site of benign biopsy for which pathology demonstrated benign breast tissue. No evidence of axillary lymphadenopathy.  01/07/2019 Right biopsy showed DCIS, left breast pathology is benign.  Additional records were obtained from previous oncology clinic.  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  Margin is negative for both invasive and DCIS negative, 0 out of 1 sentinel lymph nodes biopsy.ER>90%, PR 80-90% pT28mi pN0(sn) Previous oncologist recommended chemoprevention with tamoxifen. Patient declined due to plan of conceiving.  Tamoxifen is contraindicated with tamoxifen.Given her age and the likely waning fertility, tamoxifen was deferred as patient desires to Pursue pregnancy.   INTERVAL HISTORY Judith Watkins is a 49 y.o. female who has above history reviewed by me today presents for acute visit for evaluation of left breast mass. Patient noticed left breast mass in February.  Denies any nipple discharge.  No concerns for right breast. She currently lives in [place] with her mom.   Review of Systems  Constitutional: Negative for appetite change, chills, fatigue and fever.  HENT:   Negative for hearing loss and voice change.   Eyes: Negative for eye problems.  Respiratory: Negative for chest tightness and cough.   Cardiovascular: Negative for chest pain.  Gastrointestinal: Negative for abdominal distention, abdominal pain and blood in stool.  Endocrine: Negative for hot flashes.  Genitourinary: Negative for difficulty urinating and frequency.    Musculoskeletal:  Negative for arthralgias.  Skin: Negative for itching and rash.  Neurological: Negative for extremity weakness.  Hematological: Negative for adenopathy.  Psychiatric/Behavioral: Negative for confusion.  Left breast mass  MEDICAL HISTORY:  Past Medical History:  Diagnosis Date  . Anemia   . 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 AUGMENTATION MAMMAPLASTY Right 02/2018   due to mastectomy  . BREAST BIOPSY Left 11/2017   [place] bx mass at 12:00 periareolar benign breast tissue with cyst  . BREAST BIOPSY Right 11/2017   stereo bx of calcs,  HG DCIS  . BREAST BIOPSY Right 11/2017   [place] bx of 9:00 6 cmfn, DCIS intermediate grade with cribriform architecture  . MASTECTOMY Right 02/2018   DCIS high grade of two separte areas in right breast.  Per pt, LN clear   . right mastectomy Right 2019    SOCIAL HISTORY: Social History   Socioeconomic History  . Marital status: Single    Spouse name: Not on file  . Number of children: Not on file  . Years of education: Not on file  . Highest education level: Not on file  Occupational History  . Not on file  Tobacco Use  . Smoking status: Former Smoker    Packs/day: 0.25    Types: Cigarettes    Quit date: 2011    Years since quitting: 10.1  . Smokeless tobacco: Never Used  Substance and Sexual Activity  . Alcohol use: Yes    Alcohol/week: 2.0 standard drinks    Types: 2 Cans of beer per week  . Drug use: Never  . Sexual activity: Yes  Other Topics Concern  . Not on file  Social History Narrative  . Not on file   Social Determinants of Health   Financial Resource Strain:   . Difficulty of Paying Living Expenses: Not on file  Food Insecurity:   . Worried About Charity fundraiser in the Last Year: Not on file  . Ran Out of Food in the Last Year: Not on file  Transportation Needs:   . Lack of Transportation  (Medical): Not on file  . Lack of Transportation (Non-Medical): Not on file  Physical Activity:   . Days of Exercise per Week: Not on file  . Minutes of Exercise per Session: Not on file  Stress:   . Feeling of Stress : Not on file  Social Connections:   . Frequency of Communication with Friends and Family: Not on file  . Frequency of Social Gatherings with Friends and Family: Not on file  . Attends Religious Services: Not on file  . Active Member of Clubs or Organizations: Not on file  . Attends Archivist Meetings: Not on file  . Marital Status: Not on file  Intimate Partner Violence:   . Fear of Current or Ex-Partner: Not on file  . Emotionally Abused: Not on file  . Physically Abused: Not on file  . Sexually Abused: Not on file    FAMILY HISTORY: Family History  Problem Relation Age of Onset  . Ovarian cancer Mother 87  . Lung cancer Father     ALLERGIES:  has No Known Allergies.  MEDICATIONS:  Current Outpatient Medications  Medication Sig Dispense Refill  . ferrous sulfate 325 (65 FE) MG tablet Take 325 mg by mouth 2 (two) times daily.    Marland Kitchen ibuprofen (ADVIL) 200 MG tablet  Take 600 mg by mouth every 6 (six) hours as needed.    . Prenatal 28-0.8 MG TABS Take 1 tablet by mouth daily.     No current facility-administered medications for this visit.     PHYSICAL EXAMINATION: ECOG PERFORMANCE STATUS: 0 - Asymptomatic Vitals:   11/04/19 1350  BP: 115/77  Pulse: 69  Resp: 18  Temp: (!) 97.3 F (36.3 C)   Filed Weights   11/04/19 1350  Weight: 135 lb 8 oz (61.5 kg)    Physical Exam Constitutional:      General: She is not in acute distress. HENT:     Head: Normocephalic and atraumatic.  Eyes:     General: No scleral icterus.    Pupils: Pupils are equal, round, and reactive to light.  Cardiovascular:     Rate and Rhythm: Normal rate and regular rhythm.     Heart sounds: Normal heart sounds.  Pulmonary:     Effort: Pulmonary effort is normal.  No respiratory distress.     Breath sounds: No wheezing.  Abdominal:     General: Bowel sounds are normal. There is no distension.     Palpations: Abdomen is soft. There is no mass.     Tenderness: There is no abdominal tenderness.  Musculoskeletal:        General: No deformity. Normal range of motion.     Cervical back: Normal range of motion and neck supple.  Skin:    General: Skin is warm and dry.     Findings: No erythema or rash.  Neurological:     Mental Status: She is alert and oriented to person, place, and time.     Cranial Nerves: No cranial nerve deficit.     Coordination: Coordination normal.  Psychiatric:        Behavior: Behavior normal.        Thought Content: Thought content normal.   Breast exam is performed in seated and lying down position. Patient is status post right mastectomy with reconstruction. The implant edges are intact and there is no evidence of any chest wall recurrence.  No palpable axillary lymphadenopathy bilaterally. Palpable large left breast mass, about 5 cm,     LABORATORY DATA:  I have reviewed the data as listed Lab Results  Component Value Date   WBC 3.7 (L) 11/04/2019   HGB 11.2 (L) 11/04/2019   HCT 36.8 11/04/2019   MCV 84.6 11/04/2019   PLT 218 11/04/2019   Recent Labs    07/08/19 1407 11/04/19 1437  NA 137 135  K 3.8 3.9  CL 107 103  CO2 27 25  GLUCOSE 74 90  BUN 11 12  CREATININE 0.78 0.62  CALCIUM 8.8* 8.8*  GFRNONAA >60 >60  GFRAA >60 >60  PROT 7.3 7.8  ALBUMIN 3.7 3.9  AST 17 19  ALT 11 12  ALKPHOS 35* 44  BILITOT 0.5 0.5   Iron/TIBC/Ferritin/ %Sat No results found for: IRON, TIBC, FERRITIN, IRONPCTSAT   Reviewed patient's blood work at [employer]. 03/21/2019 WBC 3.3, hemoglobin 12.6, MCV 84.5, platelet 1 76,000. Chemistry sodium 140, potassium 4.4, chloride 107, creatinine 0.7, AST 17, ALT 13, alkaline phosphatase 39 bilirubin 0.3.  RADIOGRAPHIC STUDIES: I have personally reviewed the  radiological images as listed and agreed with the findings in the report. No results found.    ASSESSMENT & PLAN:  1. Breast mass   2. History of breast cancer in female   3. Family history of ovarian cancer    #  New left breast mass-this was not appreciable during her breast examination in November 2020, also not detectable on mammogram that was done in September 2020. Recommend unilateral left diagnostic mammogram ASAP for further work-up.  History of Stage I right breast cancer, ER/PR positive Currently not on tamoxifen as patient desires to pursue pregnancy.    #Family history of ovarian cancer, I discussed again with patient about genetic testing.  She previously declined.  Today she is agreeable with being referred to genetic counselor for discussion.  Check CBC, CMP, CA 15-3, CA 27-29  Orders Placed This Encounter  Procedures  . US Breast Limited Uni Left Inc Axilla    Standing Status:   Future    Standing Expiration Date:   01/03/2021    Order Specific Question:   Reason for Exam (SYMPTOM  OR DIAGNOSIS REQUIRED)    Answer:   mass to left breast    Order Specific Question:   Preferred imaging location?    Answer:   [facility]  . MM DIAG BREAST TOMO UNI LEFT    Standing Status:   Future    Standing Expiration Date:   11/03/2020    Order Specific Question:   Reason for Exam (SYMPTOM  OR DIAGNOSIS REQUIRED)    Answer:   mass to left brest ( approx 5cm at 9 o'clock)    Order Specific Question:   Is the patient pregnant?    Answer:   No    Order Specific Question:   Preferred imaging location?    Answer:   [facility]  . CBC with Differential/Platelet    Standing Status:   Future    Number of Occurrences:   1    Standing Expiration Date:   11/03/2020  . Comprehensive metabolic panel    Standing Status:   Future    Number of Occurrences:   1    Standing Expiration Date:   11/03/2020  . Cancer antigen 15-3    Standing Status:   Future    Number of Occurrences:   1     Standing Expiration Date:   11/03/2020  . Cancer antigen 27.29    Standing Status:   Future    Number of Occurrences:   1    Standing Expiration Date:   11/03/2020  . Ambulatory referral to Genetics    Referral Priority:   Urgent    Referral Type:   Consultation    Referral Reason:   Specialty Services Required    Referred to Provider:   Faith Rogue T    Number of Visits Requested:   1    All questions were answered. The patient knows to call the clinic with any problems questions or concerns.  cc Venetia Maxon, Rolanda Jay,*    Return of visit: To be determined We spent sufficient time to discuss many aspect of care, questions were answered to patient's satisfaction.    Earlie Server, MD, PhD Hematology Oncology [facility] at [facility] Pager- [phone] 11/04/2019

## 2019-11-05 LAB — CANCER ANTIGEN 15-3: CA 15-3: 13.1 U/mL (ref 0.0–25.0)

## 2019-11-05 LAB — CANCER ANTIGEN 27.29: CA 27.29: 20.9 U/mL (ref 0.0–38.6)

## 2019-11-06 ENCOUNTER — Encounter: Payer: Self-pay | Admitting: Licensed Clinical Social Worker

## 2019-11-06 ENCOUNTER — Inpatient Hospital Stay: Payer: 59 | Attending: Genetic Counselor | Admitting: Licensed Clinical Social Worker

## 2019-11-06 DIAGNOSIS — Z853 Personal history of malignant neoplasm of breast: Secondary | ICD-10-CM

## 2019-11-06 DIAGNOSIS — Z801 Family history of malignant neoplasm of trachea, bronchus and lung: Secondary | ICD-10-CM

## 2019-11-06 DIAGNOSIS — Z87891 Personal history of nicotine dependence: Secondary | ICD-10-CM | POA: Diagnosis not present

## 2019-11-06 DIAGNOSIS — Z8041 Family history of malignant neoplasm of ovary: Secondary | ICD-10-CM

## 2019-11-06 DIAGNOSIS — Z800 Family history of malignant neoplasm of digestive organs: Secondary | ICD-10-CM | POA: Insufficient documentation

## 2019-11-06 DIAGNOSIS — Z809 Family history of malignant neoplasm, unspecified: Secondary | ICD-10-CM

## 2019-11-06 NOTE — Progress Notes (Signed)
REFERRING PROVIDER: Earlie Server, MD [place],  [place] [address]  PRIMARY PROVIDER:  Grayland Ormond Hestle, PA-C  PRIMARY REASON FOR VISIT:  1. Family history of ovarian cancer   2. Family history of colon cancer   3. Family history of lung cancer   4. History of breast cancer in female     I connected with Judith Watkins on 11/06/2019 at 10:55 AM EDT by MyChart video conference and verified that I am speaking with the correct person using two identifiers.    Patient location: home Provider location: [place]:   Judith Watkins, a 49 y.o. female, was seen for a [place] cancer genetics consultation at the request of Dr. Tasia Catchings due to a personal and family history of cancer.  Judith Watkins presents to clinic today to discuss the possibility of a hereditary predisposition to cancer, genetic testing, and to further clarify her future cancer risks, as well as potential cancer risks for family members.   In 2019, at the age of 70, [name] was diagnosed with cancer of the right breast. This was treated with surgery.  CANCER HISTORY:  Oncology History   No history exists.     RISK FACTORS:  Menarche was at age 32.  First live birth at age no children.  OCP use for approximately 0 years.  Ovaries intact: yes.  Hysterectomy: no.  Menopausal status: premenopausal.  HRT use: 0 years. Colonoscopy: no; not examined. Mammogram within the last year: yes. Number of breast biopsies: 1. Up to date with pelvic exams: yes. Any excessive radiation exposure in the past: no  Past Medical History:  Diagnosis Date  . Anemia   . 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 Family history of colon cancer   . Family history of lung cancer   . Family history of ovarian cancer     Past Surgical History:  Procedure Laterality Date  . AUGMENTATION MAMMAPLASTY Right 02/2018   due to mastectomy  . BREAST BIOPSY Left 11/2017   [place] bx mass  at 12:00 periareolar benign breast tissue with cyst  . BREAST BIOPSY Right 11/2017   stereo bx of calcs,  HG DCIS  . BREAST BIOPSY Right 11/2017   [place] bx of 9:00 6 cmfn, DCIS intermediate grade with cribriform architecture  . MASTECTOMY Right 02/2018   DCIS high grade of two separte areas in right breast.  Per pt, LN clear   . right mastectomy Right 2019    Social History   Socioeconomic History  . Marital status: Single    Spouse name: Not on file  . Number of children: Not on file  . Years of education: Not on file  . Highest education level: Not on file  Occupational History  . Not on file  Tobacco Use  . Smoking status: Former Smoker    Packs/day: 0.25    Types: Cigarettes    Quit date: 2011    Years since quitting: 10.1  . Smokeless tobacco: Never Used  Substance and Sexual Activity  . Alcohol use: Yes    Alcohol/week: 2.0 standard drinks    Types: 2 Cans of beer per week  . Drug use: Never  . Sexual activity: Yes  Other Topics Concern  . Not on file  Social History Narrative  . Not on file   Social Determinants of Health   Financial Resource Strain:   . Difficulty of Paying  Living Expenses:   Food Insecurity:   . Worried About Charity fundraiser in the Last Year:   . Arboriculturist in the Last Year:   Transportation Needs:   . Film/video editor (Medical):   Marland Kitchen Lack of Transportation (Non-Medical):   Physical Activity:   . Days of Exercise per Week:   . Minutes of Exercise per Session:   Stress:   . Feeling of Stress :   Social Connections:   . Frequency of Communication with Friends and Family:   . Frequency of Social Gatherings with Friends and Family:   . Attends Religious Services:   . Active Member of Clubs or Organizations:   . Attends Archivist Meetings:   Marland Kitchen Marital Status:      FAMILY HISTORY:  We obtained a detailed, 4-generation family history.  Significant diagnoses are listed below: Family History  Problem Relation Age  of Onset  . Ovarian cancer Mother 23  . Colon cancer Father        dx late 66s  . Lung cancer Brother   . Cancer Maternal Uncle        unk type  . Lung cancer Cousin        dx 49s   Judith Watkins does not have children. She had 5 brothers and 3 sisters. One of her brothers had lung cancer and history of smoking and passed in his 49s. Two other brothers are also deceased.  Judith Watkins mother had ovarian cancer at 59 and is living at 17. She has not had genetic testing. Patient had 8 maternal uncles and 5 maternal aunts. One of her uncles had cancer but she is unsure the type. A maternal cousin had lung cancer in her 37s and history of smoking. Maternal grandmother died in her 14s, patient does not have information about maternal grandfather as he passed before the patient was born.  Judith Watkins father was diagnosed with colon cancer at 44 and died at 51. Patient had 2 paternal uncles, limited information about them as they did not live near the patient. They are both deceased. No information about paternal cousins or grandfather. Paternal grandmother died in her 36s.  Judith Watkins is unaware of previous family history of genetic testing for hereditary cancer risks. Patient's maternal ancestors are of [place] and [place] descent, and paternal ancestors are of African American descent. There is no reported Ashkenazi Jewish ancestry. There is no known consanguinity.  GENETIC COUNSELING ASSESSMENT: [name] is a 49 y.o. female with a personal and family history of breast and ovarian cancer which is somewhat suggestive of a hereditary cancer syndrome and predisposition to cancer. We, therefore, discussed and recommended the following at today's visit.   DISCUSSION: We discussed that 5 - 10% of breast cancer is hereditary, with most cases associated with BRCA1/BRCA2 genes, and that these genes are also associated with ovarian cancer.  There are other genes that can be associated with  hereditary breast cancer and hereditary ovarian cancer syndromes. We discussed that testing is beneficial for several reasons including knowing how to follow individuals for cancer screenings and to understand if other family members could be at risk for cancer, allowing them to undergo testing.  We reviewed the characteristics, features and inheritance patterns of hereditary cancer syndromes. We also discussed genetic testing, including the appropriate family members to test, the process of testing, insurance coverage and turn-around-time for results. We discussed the implications of a negative, positive and/or  variant of uncertain significant result. We recommended Ms. Brostrom pursue genetic testing for the [facility] Common Hereditary Cancers gene panel.   The Common Hereditary Cancers Panel offered by [facility] includes sequencing and/or deletion duplication testing of the following 48 genes: APC, ATM, AXIN2, BARD1, BMPR1A, BRCA1, BRCA2, BRIP1, CDH1, CDKN2A (p14ARF), CDKN2A (p16INK4a), CKD4, CHEK2, CTNNA1, DICER1, EPCAM (Deletion/duplication testing only), GREM1 (promoter region deletion/duplication testing only), KIT, MEN1, MLH1, MSH2, MSH3, MSH6, MUTYH, NBN, NF1, NHTL1, PALB2, PDGFRA, PMS2, POLD1, POLE, PTEN, RAD50, RAD51C, RAD51D, RNF43, SDHB, SDHC, SDHD, SMAD4, SMARCA4. STK11, TP53, TSC1, TSC2, and VHL.  The following genes were evaluated for sequence changes only: SDHA and HOXB13 c.251G>A variant only.  Based on Ms. Venezia's personal and family history of cancer, she meets medical criteria for genetic testing. Despite that she meets criteria, she may still have an out of pocket cost.   PLAN: After considering the risks, benefits, and limitations, Ms. Freund provided informed consent to pursue genetic testing. A saliva kit will be mailed to the patient and the sample will be sent to [facility] for analysis of the Common Hereditary Cancers Panel. Results should be available within approximately 2-3  weeks' time, at which point they will be disclosed by telephone to Ms. Bordelon, as will any additional recommendations warranted by these results. Ms. Martinek will receive a summary of her genetic counseling visit and a copy of her results once available. This information will also be available in Epic.   Based on Ms. Cavey's family history, we recommended her mother have genetic counseling and testing. Ms. Sarsfield will let us know if we can be of any assistance in coordinating genetic counseling and/or testing for this family member.   Ms. Riggio questions were answered to her satisfaction today. Our contact information was provided should additional questions or concerns arise. Thank you for the referral and allowing [place] to share in the care of your patient.   Faith Rogue, MS, [facility] Genetic Counselor [email or web address] Phone: [phone]  The patient was seen for a total of 30 minutes in face-to-face genetic counseling.  Drs. Magrinat, Lindi Adie and/or Burr Medico were available for discussion regarding this case.   _______________________________________________________________________ For Office Staff:  Number of people involved in session: 1 Was an Intern/ student involved with case: no

## 2019-11-12 ENCOUNTER — Ambulatory Visit
Admission: RE | Admit: 2019-11-12 | Discharge: 2019-11-12 | Disposition: A | Discharge status: Home / Self Care | Payer: 59 | Source: Ambulatory Visit | Attending: Oncology | Admitting: Oncology

## 2019-11-12 DIAGNOSIS — N63 Unspecified lump in unspecified breast: Secondary | ICD-10-CM | POA: Diagnosis present

## 2019-11-13 ENCOUNTER — Telehealth: Payer: Self-pay

## 2019-11-13 ENCOUNTER — Other Ambulatory Visit: Payer: Self-pay

## 2019-11-13 DIAGNOSIS — Z853 Personal history of malignant neoplasm of breast: Secondary | ICD-10-CM

## 2019-11-13 DIAGNOSIS — N63 Unspecified lump in unspecified breast: Secondary | ICD-10-CM

## 2019-11-13 NOTE — Telephone Encounter (Signed)
Please schedule Breast MRI in approx 6 months and then lab/MD a few days after. Patient is aware of follow up plan. Please call her with appt details and/or mail appts. thanks.

## 2019-11-13 NOTE — Telephone Encounter (Addendum)
26mths Breast MRI has been scheduled as requested. Pt has been scheduled to RTC for lab/MD a few days later.

## 2019-11-26 ENCOUNTER — Ambulatory Visit: Payer: 59

## 2020-01-05 ENCOUNTER — Ambulatory Visit: Payer: BC Managed Care – PPO | Admitting: Oncology

## 2020-01-05 ENCOUNTER — Other Ambulatory Visit: Payer: BC Managed Care – PPO

## 2020-01-28 ENCOUNTER — Encounter: Payer: Self-pay | Admitting: Licensed Clinical Social Worker

## 2020-03-28 IMAGING — MG MM DIGITAL DIAGNOSTIC UNILAT*L* W/ TOMO W/ CAD
6 series · 6 of 18 positions shown · non-contrast
Comparison: Previous exam(s).

CLINICAL DATA: 48-year-old female with history of right breast
cancer status post mastectomy. Patient presents with a new lump in
the left breast.

EXAM:
DIGITAL DIAGNOSTIC LEFT MAMMOGRAM WITH CAD AND TOMO
ULTRASOUND LEFT BREAST

[L CC synth-2D (1 of 2)]
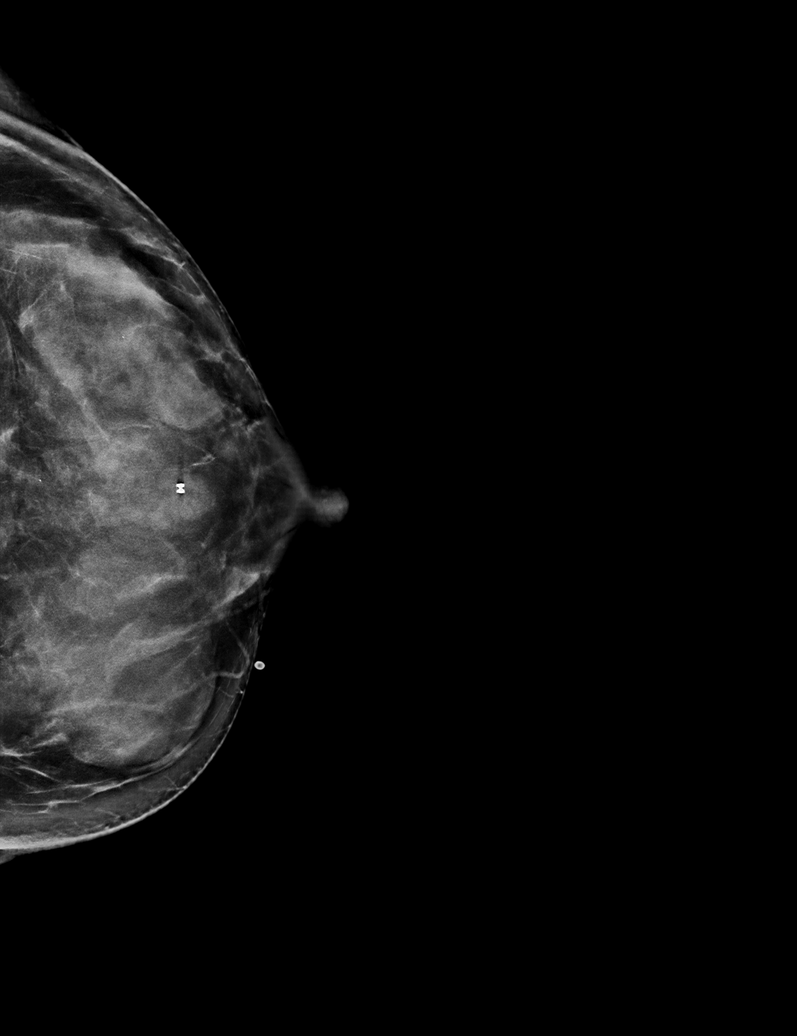

[L CC synth-2D (2 of 2)]
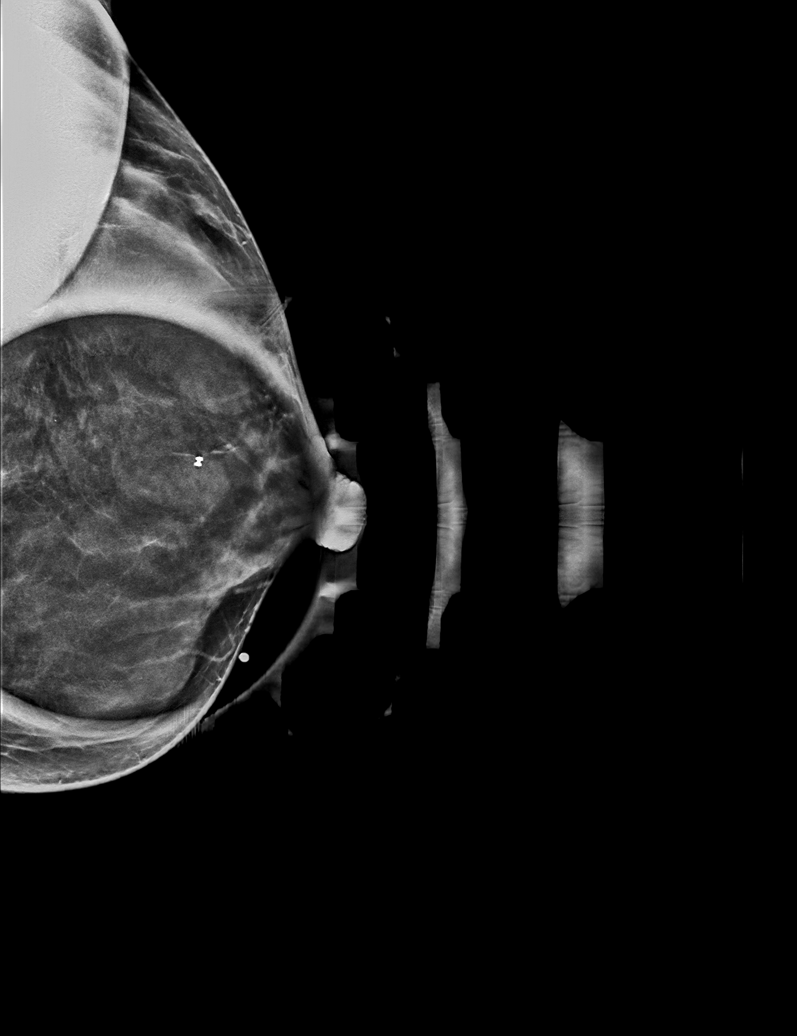

[L MLO synth-2D]
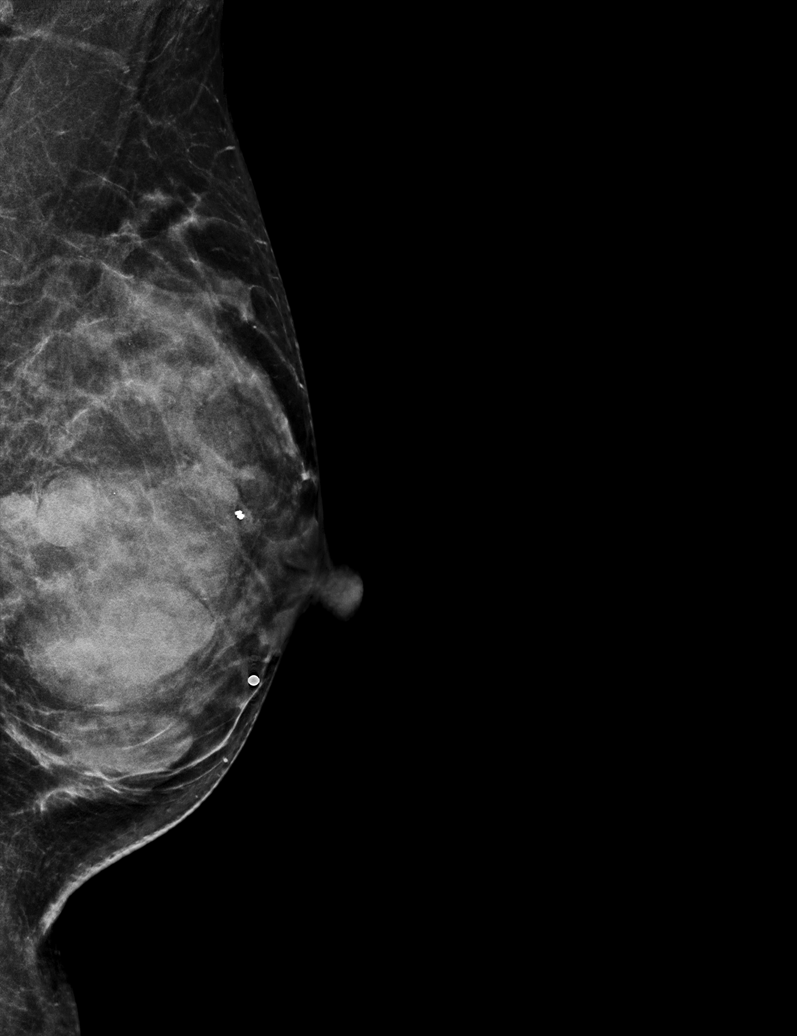

[L MLO tomo · tomo slice 27/53.0]
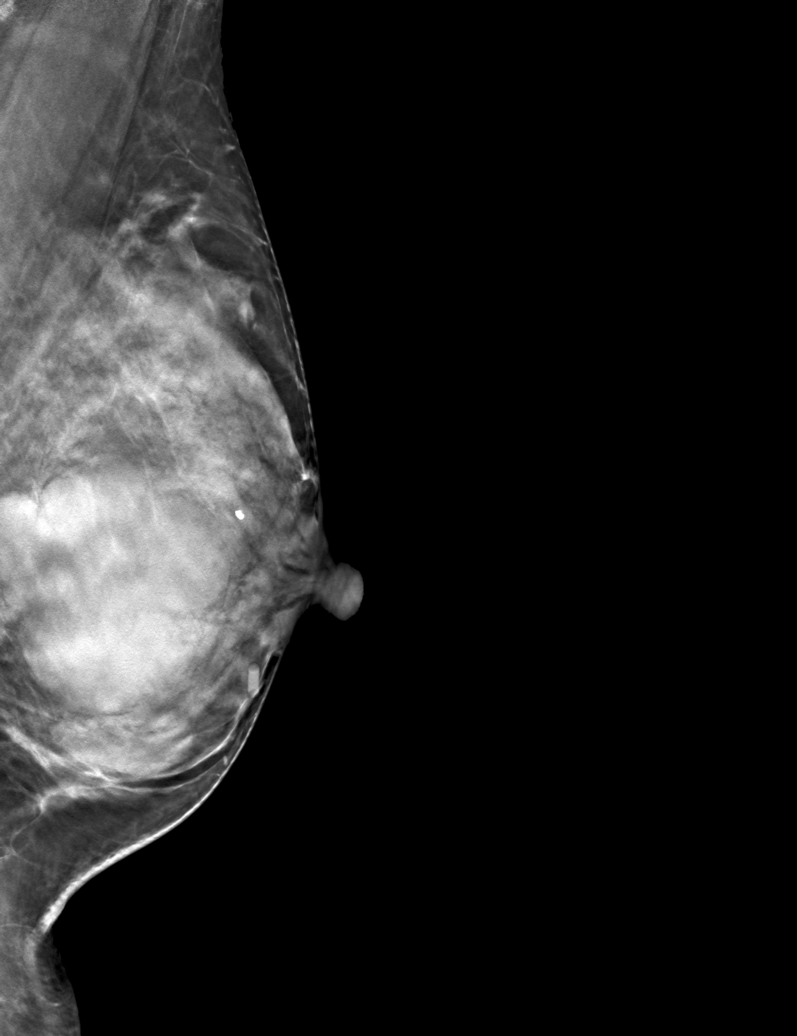

[L CC tomo (1 of 2) · tomo slice 39/76.0]
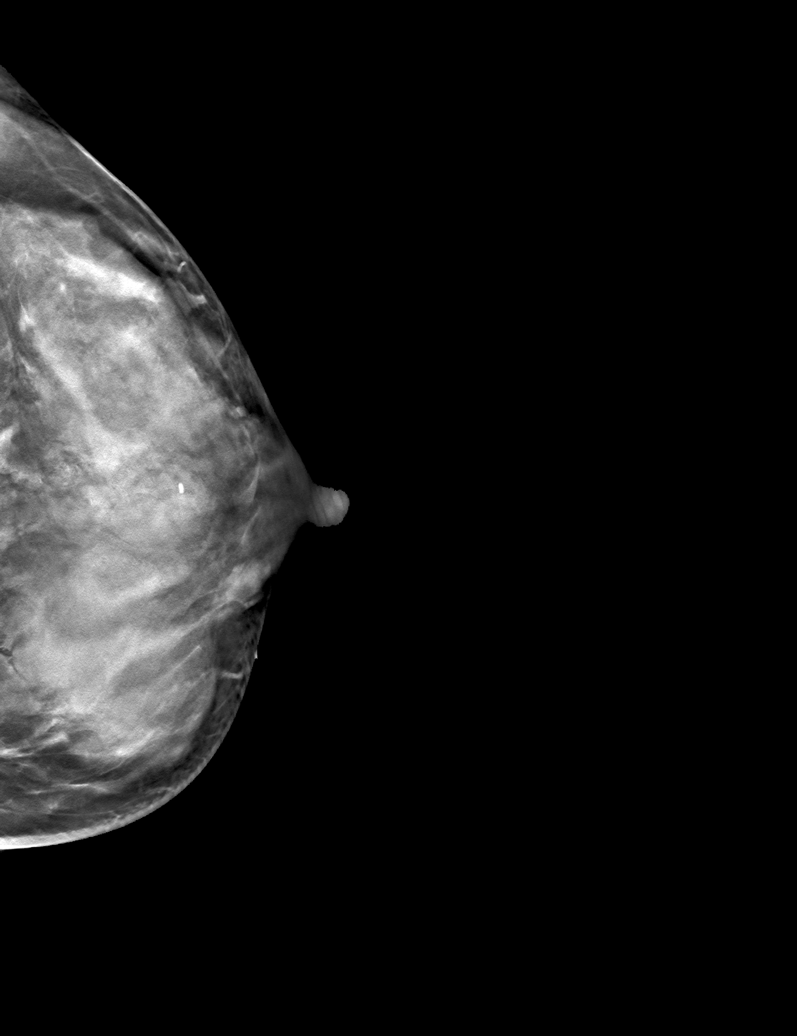

[L CC tomo (2 of 2) · tomo slice 31/60.0]
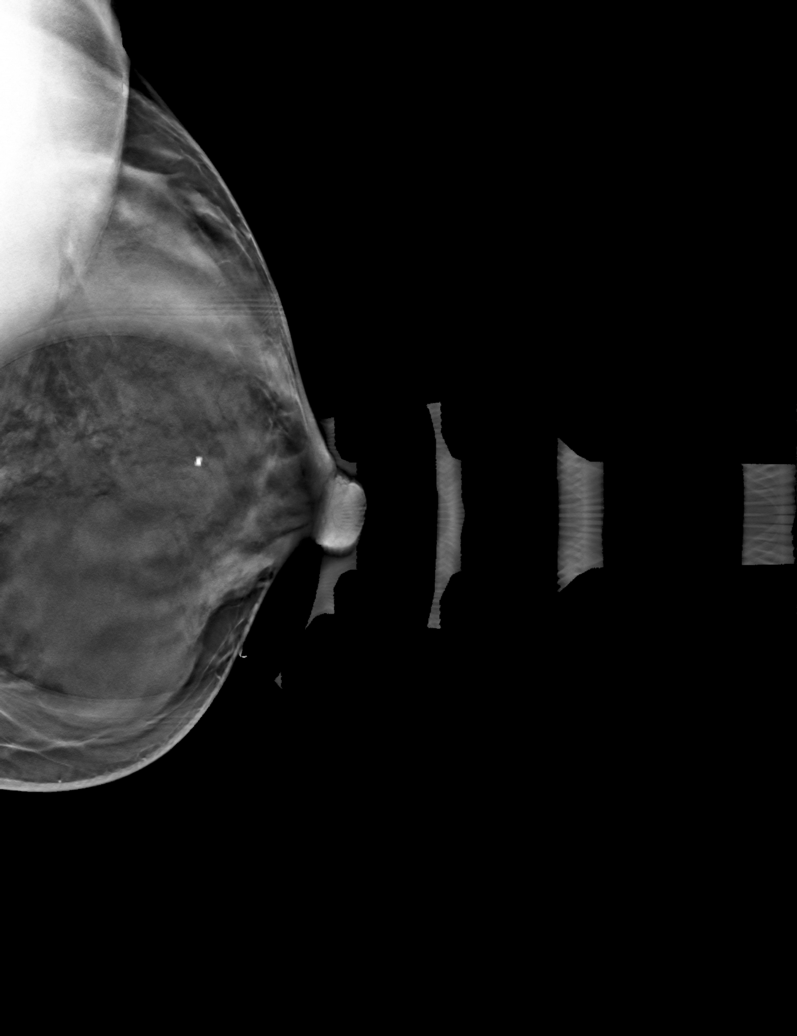

[6 of 18 positions shown; findings below may reference images not displayed]

ACR Breast Density Category d: The breast tissue is extremely dense,
which lowers the sensitivity of mammography.
FINDINGS: Mammogram:

Full field and spot compression tomosynthesis views were performed
of the left breast for evaluation of the palpable area of concern in
the medial left breast. There are multiple round and oval masses in
the left breast most likely representing fluctuating cysts which
have been demonstrated on prior MRI. At the palpable site there is
an oval circumscribed mass measuring approximately 5 cm.

Ultrasound:

Targeted ultrasound is performed in the left breast at 9 o'clock 2
cm from the nipple at the palpable site of concern there is an oval
circumscribed anechoic mass measuring 5.5 x 2.5 x 5.3 cm, consistent
with a benign simple cyst. No suspicious solid component. No
internal blood flow identified.
IMPRESSION: Left breast mass at 9 o'clock measuring 5.5 cm at the palpable site
of concern is consistent with a benign simple cyst.

RECOMMENDATION:
1. Clinical follow-up as needed for the left breast. Discussed with
the patient that the cyst would be amenable to ultrasound-guided
aspiration should it become bothersome or painful for her.

2.  Return to routine annual screening mammography.

3. Consider screening breast MRI given history of breast cancer and
extremely dense breast with fluctuating cysts.

I have discussed the findings and recommendations with the patient.
If applicable, a reminder letter will be sent to the patient
regarding the next appointment.

BI-RADS CATEGORY  2: Benign.

## 2020-05-14 ENCOUNTER — Ambulatory Visit: Payer: 59

## 2020-05-18 ENCOUNTER — Inpatient Hospital Stay: Payer: 59 | Admitting: Oncology

## 2020-05-18 ENCOUNTER — Inpatient Hospital Stay: Payer: 59 | Attending: Oncology
